# Patient Record
Sex: Male | Born: 1980 | Race: White | Hispanic: No | Marital: Married | Smoking: Never smoker
Health system: Southern US, Community
[De-identification: ages and names within clinical notes are randomized; demographics above are authoritative.]

## PROBLEM LIST (undated history)

## (undated) DIAGNOSIS — K219 Gastro-esophageal reflux disease without esophagitis: Secondary | ICD-10-CM

## (undated) HISTORY — DX: Gastro-esophageal reflux disease without esophagitis: K21.9

## (undated) HISTORY — PX: VASECTOMY: SHX75

---

## 2016-12-19 ENCOUNTER — Encounter (HOSPITAL_COMMUNITY): Payer: Self-pay

## 2016-12-19 ENCOUNTER — Emergency Department (HOSPITAL_COMMUNITY)
Admission: EM | Admit: 2016-12-19 | Discharge: 2016-12-19 | Disposition: A | Discharge status: Home / Self Care | Payer: No Typology Code available for payment source | Attending: Emergency Medicine | Admitting: Emergency Medicine

## 2016-12-19 DIAGNOSIS — Y939 Activity, unspecified: Secondary | ICD-10-CM | POA: Insufficient documentation

## 2016-12-19 DIAGNOSIS — Y999 Unspecified external cause status: Secondary | ICD-10-CM | POA: Insufficient documentation

## 2016-12-19 DIAGNOSIS — S0502XA Injury of conjunctiva and corneal abrasion without foreign body, left eye, initial encounter: Secondary | ICD-10-CM | POA: Diagnosis not present

## 2016-12-19 DIAGNOSIS — X58XXXA Exposure to other specified factors, initial encounter: Secondary | ICD-10-CM | POA: Insufficient documentation

## 2016-12-19 DIAGNOSIS — Y929 Unspecified place or not applicable: Secondary | ICD-10-CM | POA: Diagnosis not present

## 2016-12-19 MED ORDER — FLUORESCEIN SODIUM 0.6 MG OP STRP
1.0000 | ORAL_STRIP | Freq: Once | OPHTHALMIC | Status: AC
  Administered 2016-12-19: 1 via OPHTHALMIC
  Filled 2016-12-19: qty 1

## 2016-12-19 MED ORDER — TOBRAMYCIN 0.3 % OP SOLN: 2.0000 [drp] | OPHTHALMIC | 0 refills | Status: AC

## 2016-12-19 NOTE — ED Provider Notes (Signed)
[facility] DEPT Provider Note   CSN: [id number] Arrival date & time: 12/19/16  0429     History   Chief Complaint Chief Complaint  Patient presents with  . Eye Problem    HPI Andrew Wall is a 36 y.o. male.  The history is provided by the patient. No language interpreter was used.  Eye Problem   This is a new problem. The current episode started yesterday. The problem occurs constantly. The problem has not changed since onset.There is a problem in the left eye. The injury mechanism was a foreign body. The pain is moderate. There is no history of trauma to the eye. There is no known exposure to pink eye. He does not wear contacts. Pertinent negatives include no vomiting. He has tried nothing for the symptoms. The treatment provided no relief.  Pt complains of left eye pain.  Pt thinks something blew into his eye while he was on a boat.  Pt reels like something is under his eyelid   History reviewed. No pertinent past medical history.  There are no active problems to display for this patient.   History reviewed. No pertinent surgical history.     Home Medications    Prior to Admission medications   Medication Sig Start Date End Date Taking? Authorizing Provider  tobramycin (TOBREX) 0.3 % ophthalmic solution Place 2 drops into the left eye every 4 (four) hours. 12/19/16   Fransico Meadow, PA-C    Family History History reviewed. No pertinent family history.  Social History Social History  Substance Use Topics  . Smoking status: Not on file  . Smokeless tobacco: Not on file  . Alcohol use Not on file     Allergies   Patient has no known allergies.   Review of Systems Review of Systems  Gastrointestinal: Negative for vomiting.  All other systems reviewed and are negative.    Physical Exam Updated Vital Signs BP 112/75   Pulse 63   Temp 97.5 F (36.4 C) (Oral)   Resp 16   Ht 6\' 3"  (1.905 m)   Wt 88.5 kg   SpO2 100%   BMI 24.37 kg/m   Physical Exam   Constitutional: He is oriented to person, place, and time. He appears well-developed and well-nourished.  HENT:  Head: Normocephalic.  Eyes: EOM are normal. Pupils are equal, round, and reactive to light.  Injected left conjunctiva  Everted lids no foreign body, fluorescein  Small linear area of uptake,  No foreign boday  Neck: Normal range of motion.  Pulmonary/Chest: Effort normal.  Abdominal: He exhibits no distension.  Musculoskeletal: Normal range of motion.  Neurological: He is alert and oriented to person, place, and time.  Psychiatric: He has a normal mood and affect.  Nursing note and vitals reviewed.    ED Treatments / Results  Labs (all labs ordered are listed, but only abnormal results are displayed) Labs Reviewed - No data to display  EKG  EKG Interpretation None       Radiology No results found.  Procedures Procedures (including critical care time)  Medications Ordered in ED Medications  fluorescein ophthalmic strip 1 strip (1 strip Left Eye Given 12/19/16 9622)     Initial Impression / Assessment and Plan / ED Course  I have reviewed the triage vital signs and the nursing notes.  Pertinent labs & imaging results that were available during my care of the patient were reviewed by me and considered in my medical decision making (see chart for  details).       Final Clinical Impressions(s) / ED Diagnoses   Final diagnoses:  Abrasion of left cornea, initial encounter    New Prescriptions New Prescriptions   TOBRAMYCIN (TOBREX) 0.3 % OPHTHALMIC SOLUTION    Place 2 drops into the left eye every 4 (four) hours.  An After Visit Summary was printed and given to the patient.   Hollace Kinnier [place], PA-C 12/19/16 [place], MD 12/19/16 [phone]

## 2016-12-19 NOTE — Discharge Instructions (Signed)
Return if any problems.

## 2016-12-19 NOTE — ED Triage Notes (Signed)
Pt here for left eye pain, sts thought he got something in it yesteday and then went to sleep after flushing it but woke up and still bothering him.

## 2017-09-21 ENCOUNTER — Ambulatory Visit
Admission: RE | Admit: 2017-09-21 | Discharge: 2017-09-21 | Disposition: A | Discharge status: Home / Self Care | Payer: No Typology Code available for payment source | Source: Ambulatory Visit | Attending: Internal Medicine | Admitting: Internal Medicine

## 2017-09-21 ENCOUNTER — Other Ambulatory Visit: Payer: Self-pay | Admitting: Internal Medicine

## 2017-09-21 DIAGNOSIS — R05 Cough: Secondary | ICD-10-CM

## 2017-09-21 DIAGNOSIS — R059 Cough, unspecified: Secondary | ICD-10-CM

## 2019-01-11 DIAGNOSIS — B070 Plantar wart: Secondary | ICD-10-CM | POA: Diagnosis not present

## 2019-07-03 DIAGNOSIS — H0014 Chalazion left upper eyelid: Secondary | ICD-10-CM | POA: Diagnosis not present

## 2019-09-13 DIAGNOSIS — B070 Plantar wart: Secondary | ICD-10-CM | POA: Diagnosis not present

## 2019-09-13 DIAGNOSIS — D2271 Melanocytic nevi of right lower limb, including hip: Secondary | ICD-10-CM | POA: Diagnosis not present

## 2019-12-19 DIAGNOSIS — B070 Plantar wart: Secondary | ICD-10-CM | POA: Diagnosis not present

## 2020-01-09 DIAGNOSIS — H40013 Open angle with borderline findings, low risk, bilateral: Secondary | ICD-10-CM | POA: Diagnosis not present

## 2020-02-22 DIAGNOSIS — H40013 Open angle with borderline findings, low risk, bilateral: Secondary | ICD-10-CM | POA: Diagnosis not present

## 2020-02-22 DIAGNOSIS — H0102A Squamous blepharitis right eye, upper and lower eyelids: Secondary | ICD-10-CM | POA: Diagnosis not present

## 2020-02-22 DIAGNOSIS — H0102B Squamous blepharitis left eye, upper and lower eyelids: Secondary | ICD-10-CM | POA: Diagnosis not present

## 2020-02-27 DIAGNOSIS — R946 Abnormal results of thyroid function studies: Secondary | ICD-10-CM | POA: Diagnosis not present

## 2020-02-27 DIAGNOSIS — G4452 New daily persistent headache (NDPH): Secondary | ICD-10-CM | POA: Diagnosis not present

## 2020-02-28 ENCOUNTER — Ambulatory Visit
Admission: RE | Admit: 2020-02-28 | Discharge: 2020-02-28 | Disposition: A | Discharge status: Home / Self Care | Payer: BC Managed Care – PPO | Source: Ambulatory Visit | Attending: Internal Medicine | Admitting: Internal Medicine

## 2020-02-28 ENCOUNTER — Other Ambulatory Visit: Payer: Self-pay | Admitting: Internal Medicine

## 2020-02-28 DIAGNOSIS — R519 Headache, unspecified: Secondary | ICD-10-CM | POA: Diagnosis not present

## 2020-02-28 DIAGNOSIS — G4452 New daily persistent headache (NDPH): Secondary | ICD-10-CM

## 2020-02-28 MED ORDER — GADOBENATE DIMEGLUMINE 529 MG/ML IV SOLN
18.0000 mL | Freq: Once | INTRAVENOUS | Status: AC | PRN
  Administered 2020-02-28: 18 mL via INTRAVENOUS

## 2020-03-31 DIAGNOSIS — B070 Plantar wart: Secondary | ICD-10-CM | POA: Diagnosis not present

## 2020-05-06 ENCOUNTER — Other Ambulatory Visit: Payer: Self-pay | Admitting: General Surgery

## 2020-05-06 DIAGNOSIS — R109 Unspecified abdominal pain: Secondary | ICD-10-CM | POA: Diagnosis not present

## 2020-05-07 ENCOUNTER — Ambulatory Visit
Admission: RE | Admit: 2020-05-07 | Discharge: 2020-05-07 | Disposition: A | Discharge status: Home / Self Care | Payer: BC Managed Care – PPO | Source: Ambulatory Visit | Attending: General Surgery | Admitting: General Surgery

## 2020-05-07 DIAGNOSIS — R109 Unspecified abdominal pain: Secondary | ICD-10-CM

## 2020-05-07 DIAGNOSIS — R1013 Epigastric pain: Secondary | ICD-10-CM | POA: Diagnosis not present

## 2020-05-15 ENCOUNTER — Ambulatory Visit
Admission: RE | Admit: 2020-05-15 | Discharge: 2020-05-15 | Disposition: A | Discharge status: Home / Self Care | Payer: BC Managed Care – PPO | Source: Ambulatory Visit | Attending: Internal Medicine | Admitting: Internal Medicine

## 2020-05-15 ENCOUNTER — Other Ambulatory Visit: Payer: Self-pay | Admitting: Internal Medicine

## 2020-05-15 DIAGNOSIS — R2233 Localized swelling, mass and lump, upper limb, bilateral: Secondary | ICD-10-CM

## 2020-05-15 DIAGNOSIS — N6489 Other specified disorders of breast: Secondary | ICD-10-CM | POA: Diagnosis not present

## 2020-05-20 ENCOUNTER — Other Ambulatory Visit: Payer: Self-pay

## 2020-05-20 ENCOUNTER — Telehealth: Payer: Self-pay | Admitting: Internal Medicine

## 2020-05-20 DIAGNOSIS — R198 Other specified symptoms and signs involving the digestive system and abdomen: Secondary | ICD-10-CM

## 2020-05-20 DIAGNOSIS — R109 Unspecified abdominal pain: Secondary | ICD-10-CM

## 2020-05-20 DIAGNOSIS — Z8711 Personal history of peptic ulcer disease: Secondary | ICD-10-CM

## 2020-05-20 MED ORDER — SUTAB 1479-225-188 MG PO TABS: ORAL_TABLET | ORAL | 0 refills | Status: DC

## 2020-05-20 NOTE — Telephone Encounter (Signed)
Pt scheduled for ECL in the [facility] 05/22/20 at 4pm. Pt knows to arrive at 3pm and to have a driver with him for the procedure. Prep script sent to pharmacy and pt sent prep instructions. Ambulatory referral entered in epic and message sent to [facility] for precert.

## 2020-05-20 NOTE — Telephone Encounter (Signed)
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  He has a prior history of gastritis/peptic ulcer disease.  He has been on PPI, but symptoms have recurred.  Describes tightness and pain that can awaken him at night.  No alarm features to this point, such as bleeding, weight loss, or fevers.  However, symptoms seem to be exacerbated by meals with complaints of abdominal tightness.  Appetite is poor when having pain.  Recent blood work including liver tests and lipase were unremarkable.  As well, abdominal ultrasound is unremarkable.  At this point, further work-up is warranted.  PLAN:  1.  Colonoscopy and upper endoscopy in the [facility] this Thursday afternoon September 30 at 4 PM 2.  If endoscopic evaluations are unrevealing, consider advanced imaging such as CT scan and possibly gastric emptying scan.  Vaughan Basta, Please contact Dr. [name] and assist him with scheduling and instructions for his endoscopic evaluations.  Thank you.

## 2020-05-21 ENCOUNTER — Telehealth: Payer: Self-pay | Admitting: Internal Medicine

## 2020-05-21 NOTE — Telephone Encounter (Signed)
Patient called states the pharmacy does not have his prep medication please resend

## 2020-05-21 NOTE — Telephone Encounter (Signed)
Prescription was sent electronically yesterday but did not go through. Script called to pharmacy. Left message for pt to call back if very expensive and can give him manufacturer codes for pharmacy to run through.

## 2020-05-22 ENCOUNTER — Encounter: Payer: Self-pay | Admitting: Internal Medicine

## 2020-05-22 ENCOUNTER — Other Ambulatory Visit: Payer: Self-pay

## 2020-05-22 ENCOUNTER — Ambulatory Visit (AMBULATORY_SURGERY_CENTER): Payer: BC Managed Care – PPO | Admitting: Internal Medicine

## 2020-05-22 VITALS — BP 103/56 | HR 81 | Temp 98.1°F | Resp 22

## 2020-05-22 DIAGNOSIS — Z8711 Personal history of peptic ulcer disease: Secondary | ICD-10-CM

## 2020-05-22 DIAGNOSIS — R109 Unspecified abdominal pain: Secondary | ICD-10-CM

## 2020-05-22 DIAGNOSIS — D120 Benign neoplasm of cecum: Secondary | ICD-10-CM

## 2020-05-22 DIAGNOSIS — R1013 Epigastric pain: Secondary | ICD-10-CM | POA: Diagnosis not present

## 2020-05-22 DIAGNOSIS — K635 Polyp of colon: Secondary | ICD-10-CM

## 2020-05-22 DIAGNOSIS — R140 Abdominal distension (gaseous): Secondary | ICD-10-CM

## 2020-05-22 MED ORDER — SODIUM CHLORIDE 0.9 % IV SOLN: 500.0000 mL | Freq: Once | INTRAVENOUS | Status: DC

## 2020-05-22 MED ORDER — HYOSCYAMINE SULFATE 0.125 MG SL SUBL: 0.1250 mg | SUBLINGUAL_TABLET | SUBLINGUAL | 1 refills | Status: AC | PRN

## 2020-05-22 NOTE — Progress Notes (Signed)
PT taken to PACU. Monitors in place. VSS. Report given to RN. 

## 2020-05-22 NOTE — Op Note (Signed)
[facility] Patient Name: Andrew Wall Procedure Date: 05/22/2020 3:55 PM MRN: [id number] Endoscopist: Docia Chuck. Henrene Pastor , MD Age: 39 Referring MD:  Date of Birth: [birth year] Gender: Male Account #: [id number] Procedure:                Upper GI endoscopy with biopsies Indications:              Epigastric abdominal pain. History of acid peptic                            disorder Medicines:                Monitored Anesthesia Care Procedure:                Pre-Anesthesia Assessment:                           - Prior to the procedure, a History and Physical                            was performed, and patient medications and                            allergies were reviewed. The patient's tolerance of                            previous anesthesia was also reviewed. The risks                            and benefits of the procedure and the sedation                            options and risks were discussed with the patient.                            All questions were answered, and informed consent                            was obtained. Prior Anticoagulants: The patient has                            taken no previous anticoagulant or antiplatelet                            agents. ASA Grade Assessment: I - A normal, healthy                            patient. After reviewing the risks and benefits,                            the patient was deemed in satisfactory condition to                            undergo the procedure.  After obtaining informed consent, the endoscope was                            passed under direct vision. Throughout the                            procedure, the patient's blood pressure, pulse, and                            oxygen saturations were monitored continuously. The                            Endoscope was introduced through the mouth, and                            advanced to the third part of duodenum. The upper                             GI endoscopy was accomplished without difficulty.                            The patient tolerated the procedure well. Scope In: Scope Out: Findings:                 The esophagus was normal.                           The stomach was normal. Biopsies were taken with a                            cold forceps for Helicobacter pylori testing using                            CLOtest.                           The examined duodenum was normal.                           The cardia and gastric fundus were normal on                            retroflexion. Complications:            No immediate complications. Estimated Blood Loss:     Estimated blood loss: none. Impression:               1. Normal EGD                           2. Epigastric pain                           3. History of ulcer disease. Status post CLO biopsy. Recommendation:           1. Prescribe Levsin sublingual (or generic  equivalent) 0.125 mg; #60; 1 refill; take 1 or 2                            sublingual every 4 hours as needed for pain                           2. Follow-up CLO biopsy assessing for Helicobacter                            pylori                           3. Schedule contrast-enhanced CT scan of the                            abdomen and pelvis with fine cuts to the pancreas                            "epigastric pain exacerbated by meals. Negative                            endoscopic evaluations".                           4. Follow-up to be determined after the above Docia Chuck. Henrene Pastor, MD 05/22/2020 4:45:03 PM This report has been signed electronically.

## 2020-05-22 NOTE — Op Note (Signed)
[facility] Patient Name: Andrew Wall Procedure Date: 05/22/2020 3:56 PM MRN: [id number] Endoscopist: Docia Chuck. Henrene Pastor , MD Age: 39 Referring MD:  Date of Birth: 19-Apr-1981 Gender: Male Account #: [id number] Procedure:                Colonoscopy with cold snare polypectomy x 1 Indications:              Abdominal pain. Unexplained. Exacerbated by meals                            and associated with tightness. Medicines:                Monitored Anesthesia Care Procedure:                Pre-Anesthesia Assessment:                           - Prior to the procedure, a History and Physical                            was performed, and patient medications and                            allergies were reviewed. The patient's tolerance of                            previous anesthesia was also reviewed. The risks                            and benefits of the procedure and the sedation                            options and risks were discussed with the patient.                            All questions were answered, and informed consent                            was obtained. Prior Anticoagulants: The patient has                            taken no previous anticoagulant or antiplatelet                            agents. ASA Grade Assessment: I - A normal, healthy                            patient. After reviewing the risks and benefits,                            the patient was deemed in satisfactory condition to                            undergo the procedure.  After obtaining informed consent, the colonoscope                            was passed under direct vision. Throughout the                            procedure, the patient's blood pressure, pulse, and                            oxygen saturations were monitored continuously. The                            Colonoscope was introduced through the anus and                            advanced to the  the cecum, identified by                            appendiceal orifice and ileocecal valve. The                            terminal ileum, ileocecal valve, appendiceal                            orifice, and rectum were photographed. The quality                            of the bowel preparation was excellent. The                            colonoscopy was performed without difficulty. The                            patient tolerated the procedure well. The bowel                            preparation used was SUPREP via split dose                            instruction. Scope In: 3:61:44 PM Scope Out: 4:19:53 PM Scope Withdrawal Time: 0 hours 11 minutes 12 seconds  Total Procedure Duration: 0 hours 13 minutes 11 seconds  Findings:                 The terminal ileum appeared normal.                           A 3 mm polyp was found in the cecum. The polyp was                            sessile. The polyp was removed with a cold snare.                            Resection and retrieval were complete.  The exam was otherwise without abnormality on                            direct and retroflexion views. Complications:            No immediate complications. Estimated blood loss:                            None. Estimated Blood Loss:     Estimated blood loss: none. Impression:               - The examined portion of the ileum was normal.                           - One 3 mm polyp in the cecum, removed with a cold                            snare. Resected and retrieved.                           - The examination was otherwise normal on direct                            and retroflexion views. Recommendation:           - Repeat colonoscopy in 10 years for surveillance.                           - Patient has a contact number available for                            emergencies. The signs and symptoms of potential                            delayed complications  were discussed with the                            patient. Return to normal activities tomorrow.                            Written discharge instructions were provided to the                            patient.                           - Resume previous diet.                           - Continue present medications.                           - Await pathology results.                           - EGD today. Please see report regarding findings  and final recommendations Docia Chuck. Henrene Pastor, MD 05/22/2020 4:40:37 PM This report has been signed electronically.

## 2020-05-22 NOTE — Progress Notes (Signed)
Orders received from Dr. Henrene Pastor for CT scan with contrast. 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 RN and charge RN attempted to reach nursing staff in Dr Blanch Media office to schedule this, unfortunately they had left for the day. Charge RN left copy of report and message for that RN to see and order the CT scan first thing in the morning. Pt and his wife made aware of this plan. Pt discharged with oral contrast per [facility] policy.

## 2020-05-22 NOTE — Progress Notes (Signed)
Called to room to assist during endoscopic procedure.  Patient ID and intended procedure confirmed with present staff. Received instructions for my participation in the procedure from the performing physician.  

## 2020-05-22 NOTE — Patient Instructions (Signed)
Handout provided on polyps.   Take Levsin sublingual (or generic equivalent) 0.125mg ; take 1-2 sublingual every 4 hours as needed for pain.   Schedule contrast-enhanced CT scan of the abdomen and pelvis with fine cuts to the pancreas "epigastric pain exacerbated by meals. Negative endoscopic evaluations." Dr. Blanch Media office will call to schedule this; CT contrast and instructions provided.  YOU HAD AN ENDOSCOPIC PROCEDURE TODAY AT [facility]:   Refer to the procedure report that was given to you for any specific questions about what was found during the examination.  If the procedure report does not answer your questions, please call your gastroenterologist to clarify.  If you requested that your care partner not be given the details of your procedure findings, then the procedure report has been included in a sealed envelope for you to review at your convenience later.  YOU SHOULD EXPECT: Some feelings of bloating in the abdomen. Passage of more gas than usual.  Walking can help get rid of the air that was put into your GI tract during the procedure and reduce the bloating. If you had a lower endoscopy (such as a colonoscopy or flexible sigmoidoscopy) you may notice spotting of blood in your stool or on the toilet paper. If you underwent a bowel prep for your procedure, you may not have a normal bowel movement for a few days.  Please Note:  You might notice some irritation and congestion in your nose or some drainage.  This is from the oxygen used during your procedure.  There is no need for concern and it should clear up in a day or so.  SYMPTOMS TO REPORT IMMEDIATELY:   Following lower endoscopy (colonoscopy or flexible sigmoidoscopy):  Excessive amounts of blood in the stool  Significant tenderness or worsening of abdominal pains  Swelling of the abdomen that is new, acute  Fever of 100F or higher   Following upper endoscopy (EGD)  Vomiting of blood or coffee ground  material  New chest pain or pain under the shoulder blades  Painful or persistently difficult swallowing  New shortness of breath  Fever of 100F or higher  Black, tarry-looking stools  For urgent or emergent issues, a gastroenterologist can be reached at any hour by calling [phone]. Do not use MyChart messaging for urgent concerns.    DIET:  We do recommend a small meal at first, but then you may proceed to your regular diet.  Drink plenty of fluids but you should avoid alcoholic beverages for 24 hours.  ACTIVITY:  You should plan to take it easy for the rest of today and you should NOT DRIVE or use heavy machinery until tomorrow (because of the sedation medicines used during the test).    FOLLOW UP: Our staff will call the number listed on your records 48-72 hours following your procedure to check on you and address any questions or concerns that you may have regarding the information given to you following your procedure. If we do not reach you, we will leave a message.  We will attempt to reach you two times.  During this call, we will ask if you have developed any symptoms of COVID 19. If you develop any symptoms (ie: fever, flu-like symptoms, shortness of breath, cough etc.) before then, please call [phone].  If you test positive for Covid 19 in the 2 weeks post procedure, please call and report this information to [place].    If any biopsies were taken you will be contacted  by phone or by letter within the next 1-3 weeks.  Please call us at [phone] if you have not heard about the biopsies in 3 weeks.    SIGNATURES/CONFIDENTIALITY: You and/or your care partner have signed paperwork which will be entered into your electronic medical record.  These signatures attest to the fact that that the information above on your After Visit Summary has been reviewed and is understood.  Full responsibility of the confidentiality of this discharge information lies with you and/or your  care-partner.

## 2020-05-23 ENCOUNTER — Other Ambulatory Visit: Payer: Self-pay

## 2020-05-23 ENCOUNTER — Ambulatory Visit (INDEPENDENT_AMBULATORY_CARE_PROVIDER_SITE_OTHER)
Admission: RE | Admit: 2020-05-23 | Discharge: 2020-05-23 | Disposition: A | Discharge status: Home / Self Care | Payer: BC Managed Care – PPO | Source: Ambulatory Visit | Attending: Internal Medicine | Admitting: Internal Medicine

## 2020-05-23 ENCOUNTER — Encounter: Payer: BC Managed Care – PPO | Admitting: Internal Medicine

## 2020-05-23 DIAGNOSIS — R109 Unspecified abdominal pain: Secondary | ICD-10-CM

## 2020-05-23 DIAGNOSIS — R1010 Upper abdominal pain, unspecified: Secondary | ICD-10-CM | POA: Diagnosis not present

## 2020-05-23 LAB — HELICOBACTER PYLORI SCREEN-BIOPSY: UREASE: NEGATIVE

## 2020-05-23 MED ORDER — IOHEXOL 300 MG/ML  SOLN
100.0000 mL | Freq: Once | INTRAMUSCULAR | Status: AC | PRN
  Administered 2020-05-23: 11:00:00 100 mL via INTRAVENOUS

## 2020-05-26 ENCOUNTER — Telehealth: Payer: Self-pay | Admitting: *Deleted

## 2020-05-26 NOTE — Telephone Encounter (Signed)
  Follow up Call-  Call back number 05/22/2020  Post procedure Call Back phone  # [phone]  Permission to leave phone message Yes  Some recent data might be hidden     Patient questions:  Do you have a fever, pain , or abdominal swelling? No. Pain Score  0 *  Have you tolerated food without any problems? Yes.    Have you been able to return to your normal activities? Yes.    Do you have any questions about your discharge instructions: Diet   No. Medications  No. Follow up visit  No.  Do you have questions or concerns about your Care? No.  Actions: * If pain score is 4 or above: No action needed, pain <4.  1. Have you developed a fever since your procedure? no  2.   Have you had an respiratory symptoms (SOB or cough) since your procedure? no  3.   Have you tested positive for COVID 19 since your procedure no  4.   Have you had any family members/close contacts diagnosed with the COVID 19 since your procedure?  no   If yes to any of these questions please route to Joylene John, RN and Joella Prince, RN

## 2020-05-28 DIAGNOSIS — B078 Other viral warts: Secondary | ICD-10-CM | POA: Diagnosis not present

## 2020-05-28 DIAGNOSIS — B079 Viral wart, unspecified: Secondary | ICD-10-CM | POA: Diagnosis not present

## 2020-05-29 ENCOUNTER — Encounter: Payer: Self-pay | Admitting: Internal Medicine

## 2020-06-04 DIAGNOSIS — Z23 Encounter for immunization: Secondary | ICD-10-CM | POA: Diagnosis not present

## 2020-07-03 ENCOUNTER — Other Ambulatory Visit: Payer: Self-pay | Admitting: Internal Medicine

## 2020-07-03 ENCOUNTER — Ambulatory Visit
Admission: RE | Admit: 2020-07-03 | Discharge: 2020-07-03 | Disposition: A | Discharge status: Home / Self Care | Payer: BC Managed Care – PPO | Source: Ambulatory Visit | Attending: Internal Medicine | Admitting: Internal Medicine

## 2020-07-03 ENCOUNTER — Other Ambulatory Visit: Payer: Self-pay

## 2020-07-03 ENCOUNTER — Other Ambulatory Visit (HOSPITAL_COMMUNITY)
Admission: RE | Admit: 2020-07-03 | Discharge: 2020-07-03 | Disposition: A | Discharge status: Home / Self Care | Payer: BC Managed Care – PPO | Source: Ambulatory Visit | Attending: Diagnostic Radiology | Admitting: Diagnostic Radiology

## 2020-07-03 DIAGNOSIS — R590 Localized enlarged lymph nodes: Secondary | ICD-10-CM

## 2020-07-03 DIAGNOSIS — R599 Enlarged lymph nodes, unspecified: Secondary | ICD-10-CM | POA: Diagnosis not present

## 2020-07-04 LAB — SURGICAL PATHOLOGY

## 2020-08-01 DIAGNOSIS — H40013 Open angle with borderline findings, low risk, bilateral: Secondary | ICD-10-CM | POA: Diagnosis not present

## 2020-08-01 DIAGNOSIS — D225 Melanocytic nevi of trunk: Secondary | ICD-10-CM | POA: Diagnosis not present

## 2020-08-01 DIAGNOSIS — B070 Plantar wart: Secondary | ICD-10-CM | POA: Diagnosis not present

## 2020-08-01 DIAGNOSIS — H5711 Ocular pain, right eye: Secondary | ICD-10-CM | POA: Diagnosis not present

## 2020-10-27 DIAGNOSIS — R82998 Other abnormal findings in urine: Secondary | ICD-10-CM | POA: Diagnosis not present

## 2020-12-06 IMAGING — CT CT ABD-PELV W/ CM
2 of 4 series · 17 of 46 positions shown, 19 images · IV contrast (OMNIPAQUE 300)
Comparison: 05/07/2020 abdominal sonogram.

CLINICAL DATA: Intermittent upper abdominal pain for few weeks.
Normal ultrasound. Unremarkable upper and lower endoscopy 1 day
prior.

EXAM:
CT ABDOMEN AND PELVIS WITH CONTRAST
TECHNIQUE: Multidetector CT imaging of the abdomen and pelvis was performed
using the standard protocol following bolus administration of
intravenous contrast.
CONTRAST:  100mL OMNIPAQUE IOHEXOL 300 MG/ML  SOLN

[Series 2: abd/pel w · axial · 0.84mm/px · z∈[+829,+1264]mm · 14 of 95 slices shown, 16 images]
[im 4/95  soft-tissue]
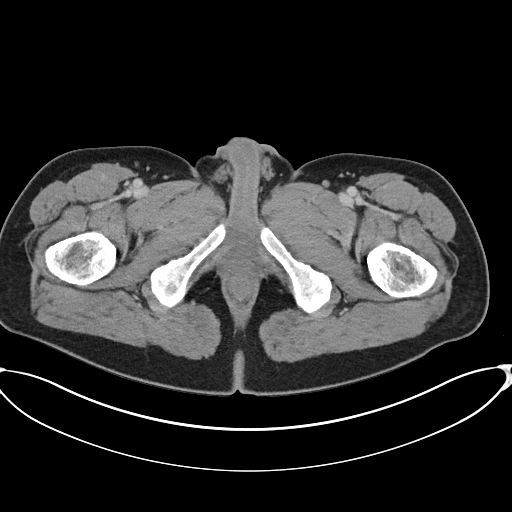
[im 4/95  bone]
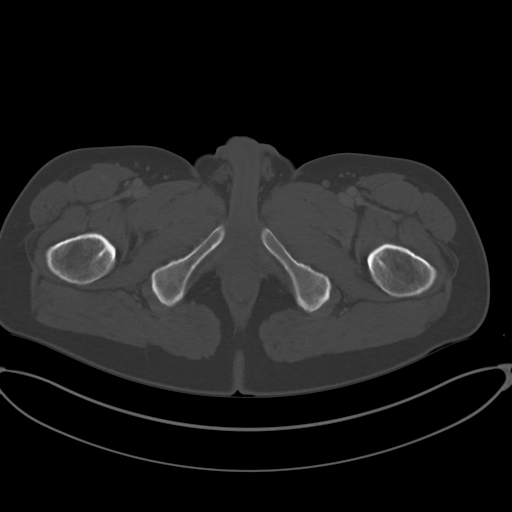
[im 12/95  soft-tissue]
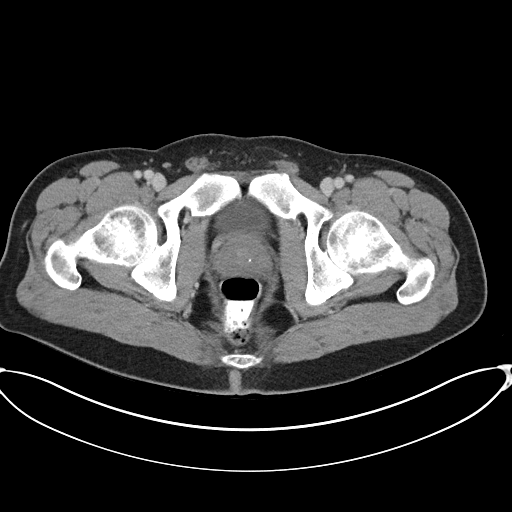
[im 19/95  soft-tissue]
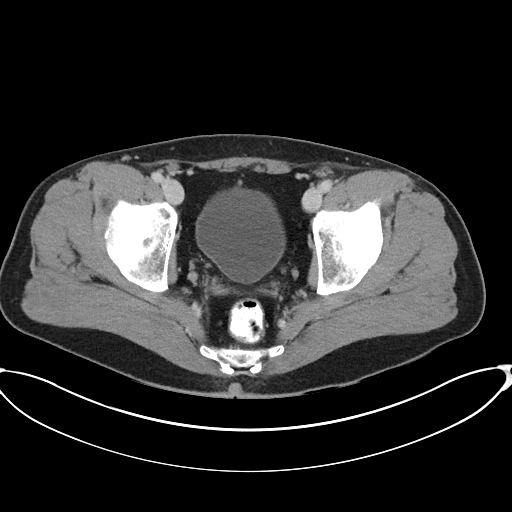
[im 27/95  soft-tissue]
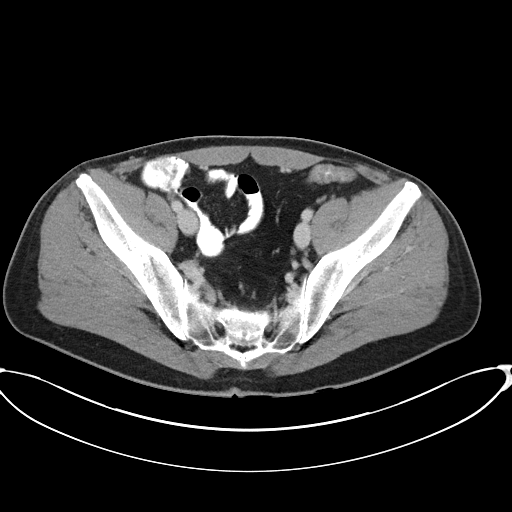
[im 31/95  soft-tissue]
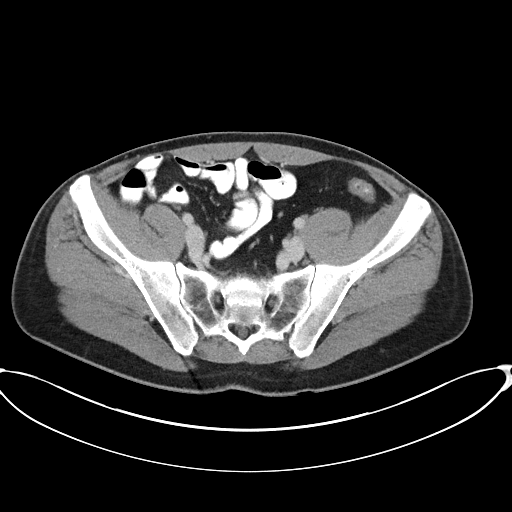
[im 38/95  soft-tissue]
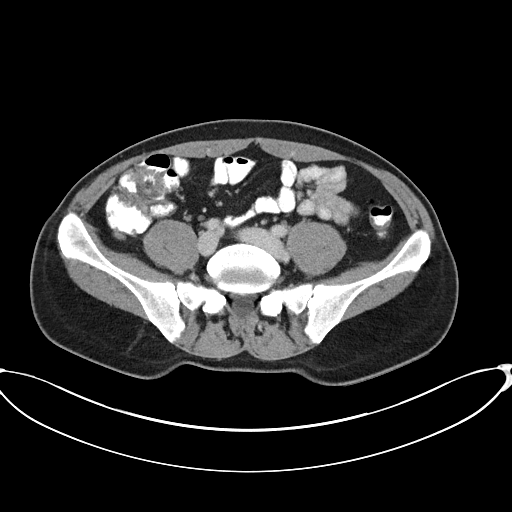
[im 46/95  soft-tissue]
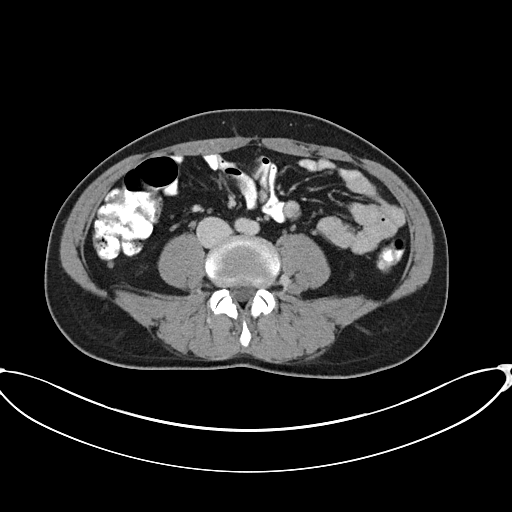
[im 49/95  soft-tissue]
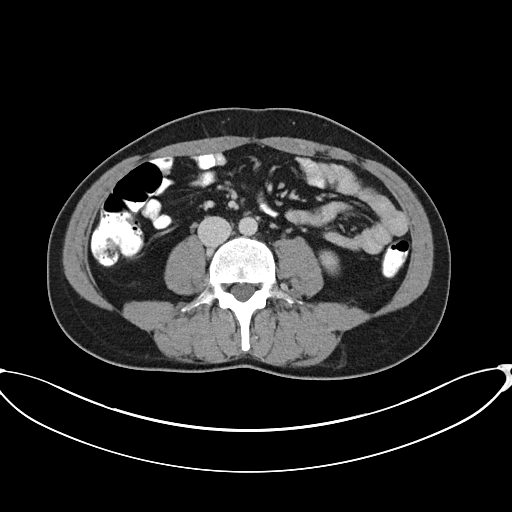
[im 57/95  soft-tissue]
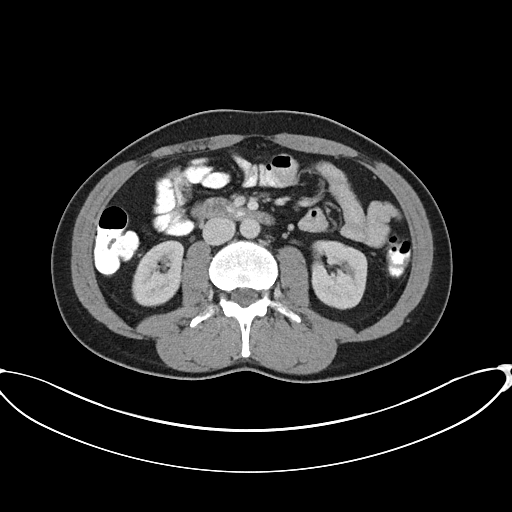
[im 57/95  bone]
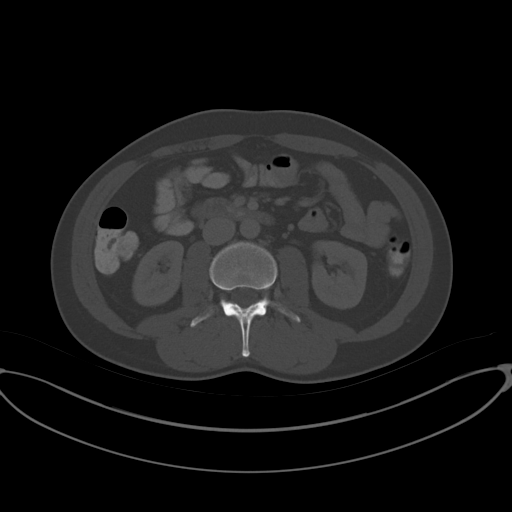
[im 64/95  soft-tissue]
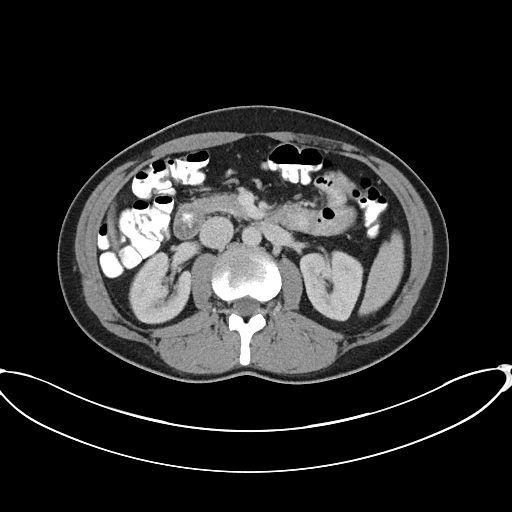
[im 72/95  soft-tissue]
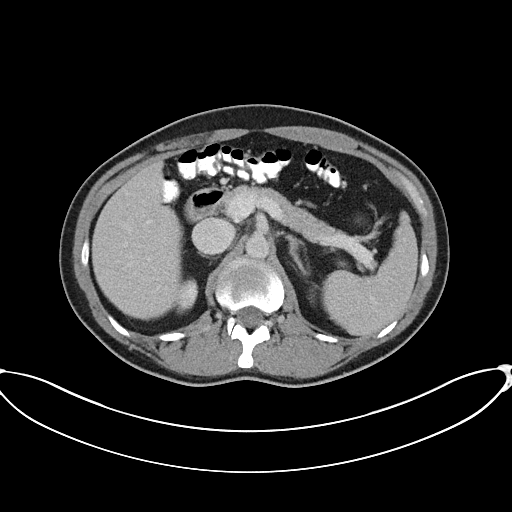
[im 76/95  soft-tissue]
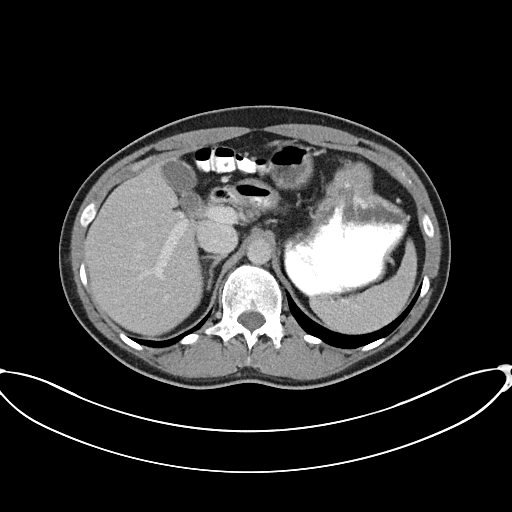
[im 83/95  soft-tissue]
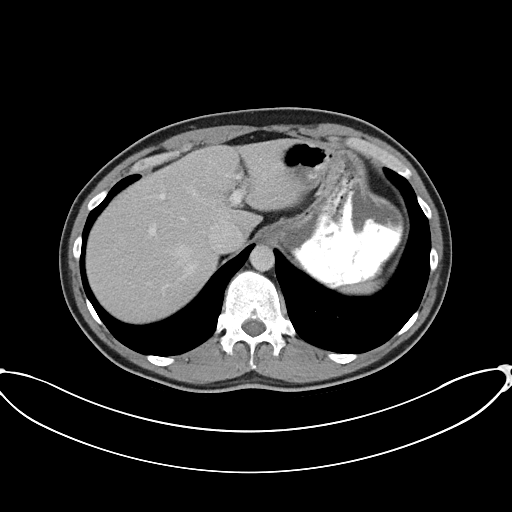
[im 91/95  soft-tissue]
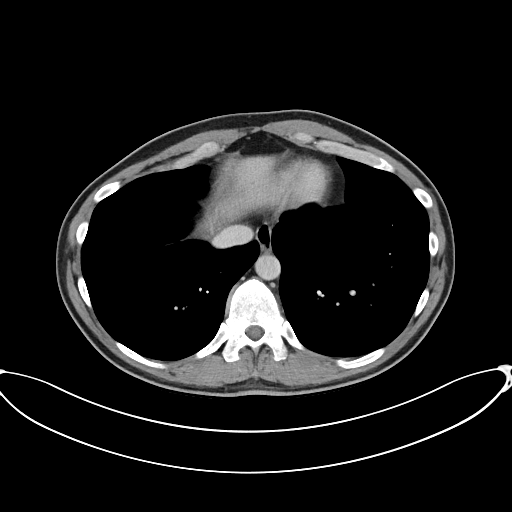

[Series 5: abd/pel w st · coronal · 0.87mm/px · 3 of 100 slices shown]
[im 34/100  soft-tissue]
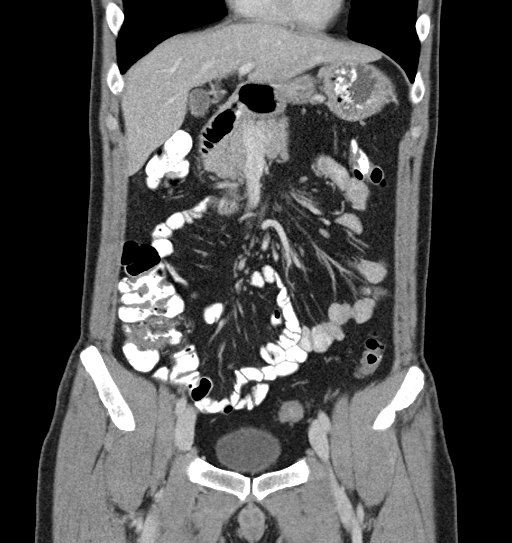
[im 45/100  soft-tissue]
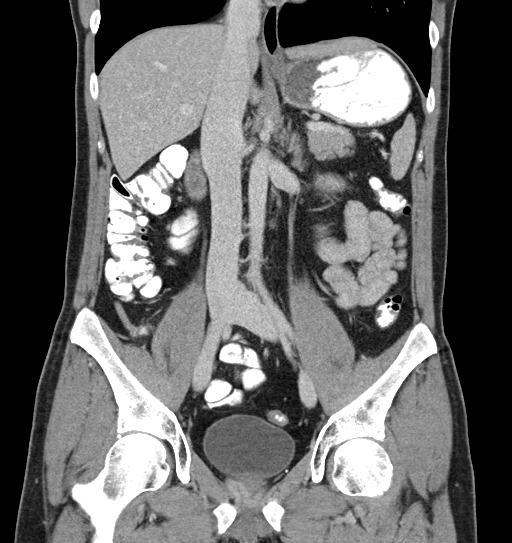
[im 56/100  soft-tissue]
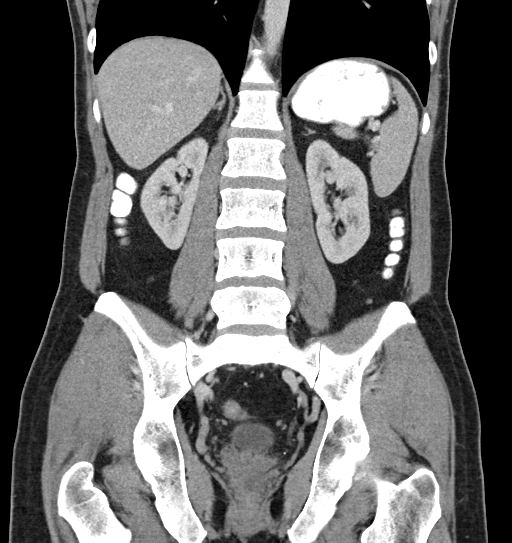

[17 of 46 positions shown; findings below may reference images not displayed]

FINDINGS: Lower chest: No significant pulmonary nodules or acute consolidative
airspace disease.

Hepatobiliary: Normal liver size. No liver mass. Normal gallbladder
with no radiopaque cholelithiasis. No biliary ductal dilatation.

Pancreas: Normal, with no mass or duct dilation.

Spleen: Normal size. No mass.

Adrenals/Urinary Tract: Normal adrenals. Normal kidneys with no
hydronephrosis and no renal mass. Normal bladder.

Stomach/Bowel: Normal non-distended stomach. Normal caliber small
bowel with no small bowel wall thickening. Normal appendix. Oral
contrast transits to the rectum. Normal large bowel with no
diverticulosis, large bowel wall thickening or pericolonic fat
stranding.

Vascular/Lymphatic: Normal caliber abdominal aorta. Patent portal,
splenic, hepatic and renal veins. No pathologically enlarged lymph
nodes in the abdomen or pelvis.

Reproductive: Normal size prostate.

Other: No pneumoperitoneum, ascites or focal fluid collection.

Musculoskeletal: No aggressive appearing focal osseous lesions.
IMPRESSION: Normal CT of the abdomen and pelvis. No acute abnormality. No CT
findings to explain the patient's presenting symptoms.

## 2021-01-02 DIAGNOSIS — B070 Plantar wart: Secondary | ICD-10-CM | POA: Diagnosis not present

## 2021-01-16 DIAGNOSIS — H5711 Ocular pain, right eye: Secondary | ICD-10-CM | POA: Diagnosis not present

## 2021-01-16 IMAGING — US US AXILLARY NODE CORE BIOPSY LEFT
1 series · 15 of 17 positions shown · non-contrast
Comparison: Previous exam(s).
COMPARISON: Previous exam(s).

Addendum:
CLINICAL DATA: Patient presents for follow-up versus possible
biopsy of bilateral axillary lymph nodes.

[Series 1: us axillary node core biopsy left · 0.06mm/px · 15 of 17 slices shown]
[im 1/17]
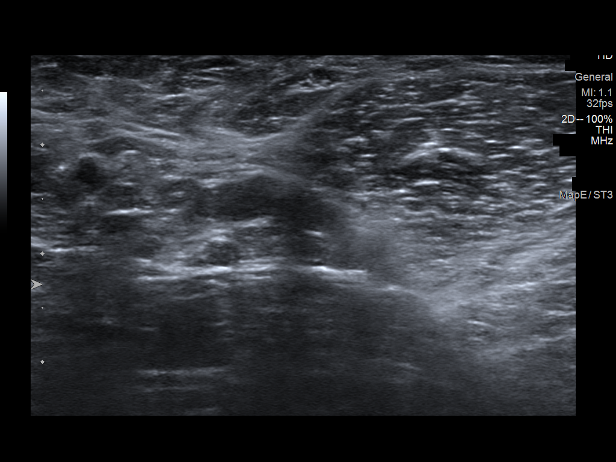
[im 2/17]
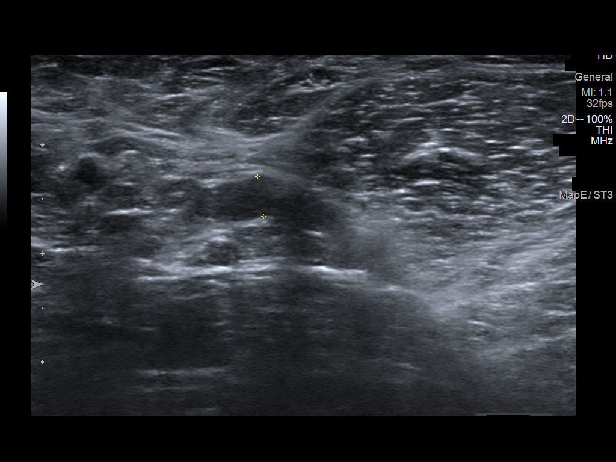
[im 3/17]
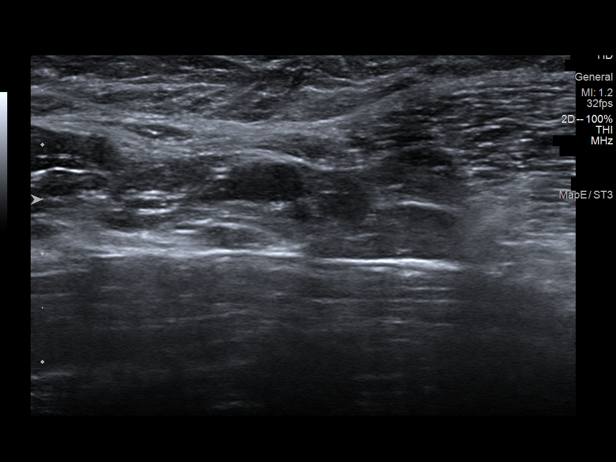
[im 4/17]
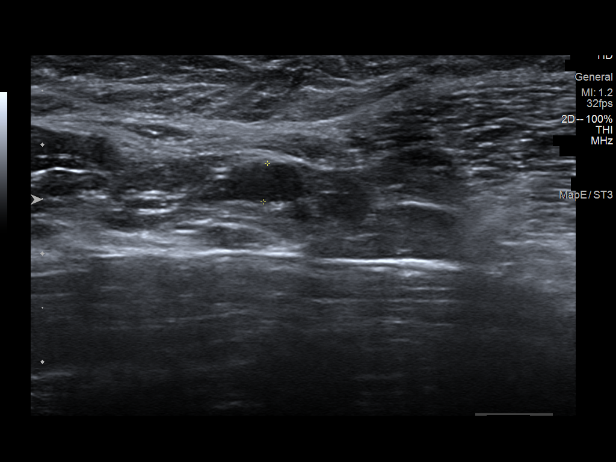
[im 6/17]
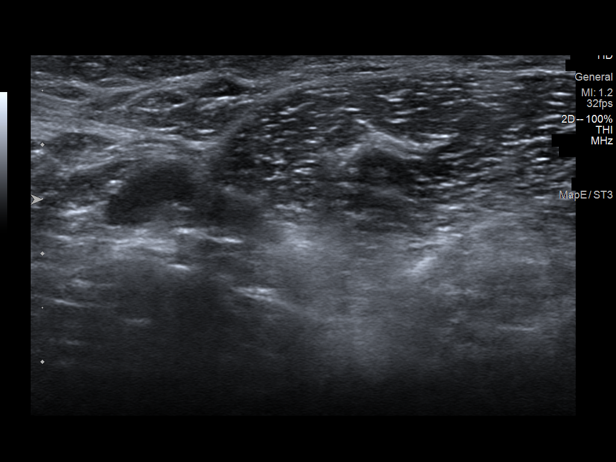
[im 7/17]
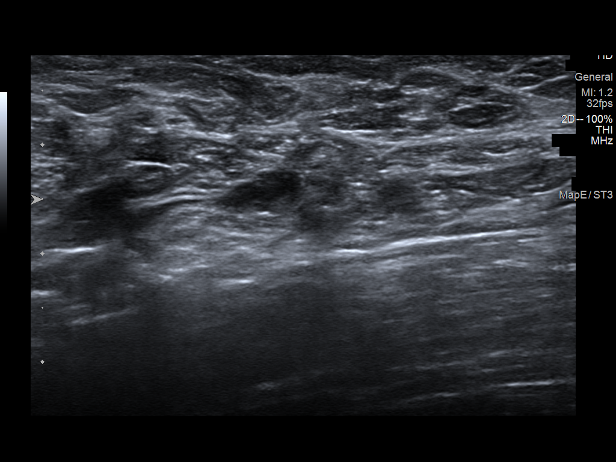
[im 8/17]
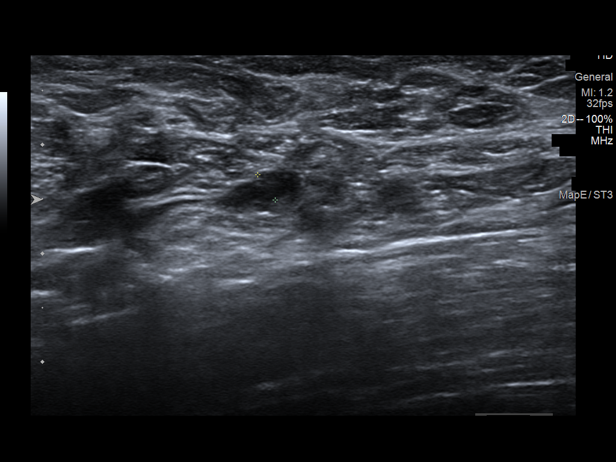
[im 9/17]
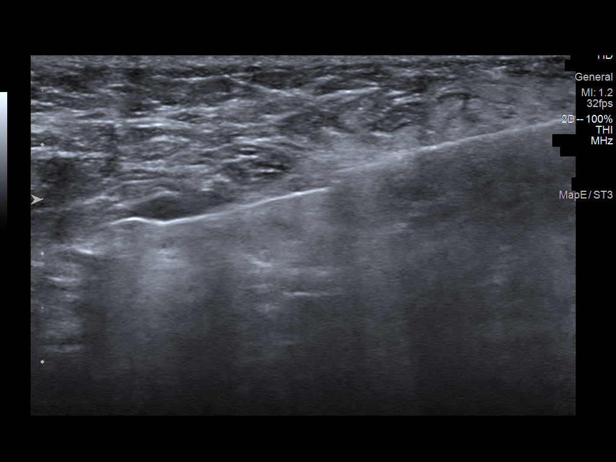
[im 10/17]
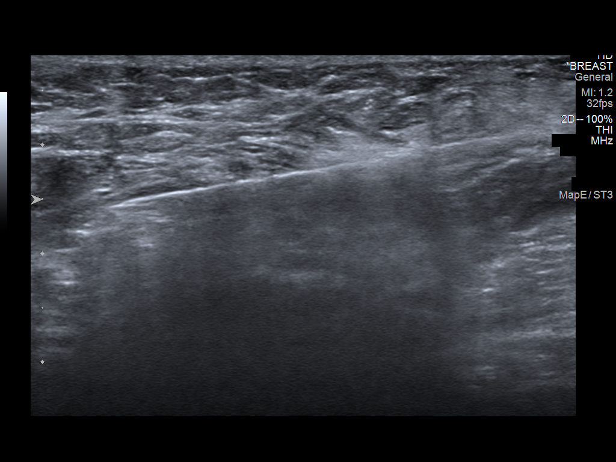
[im 11/17]
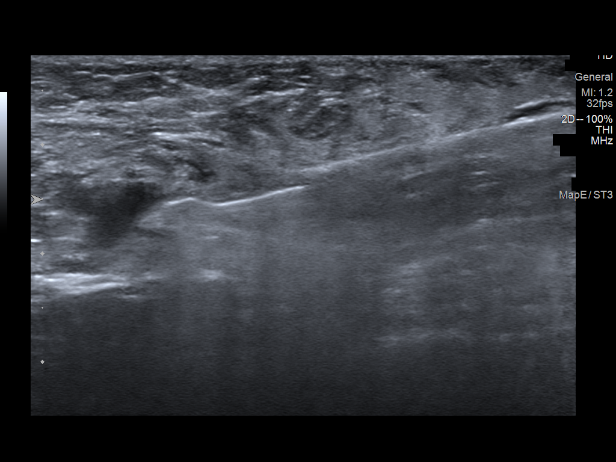
[im 12/17]
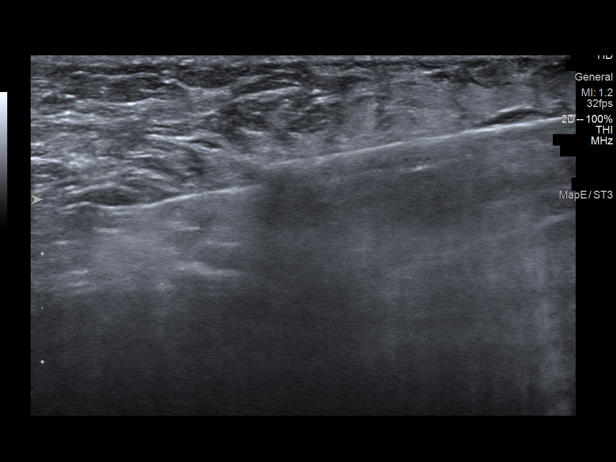
[im 14/17]
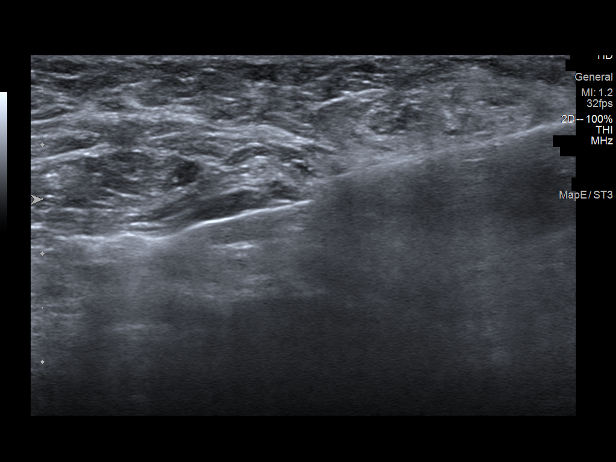
[im 15/17]
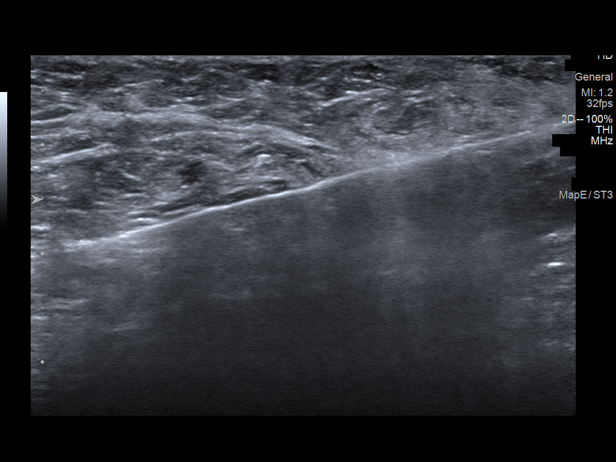
[im 16/17]
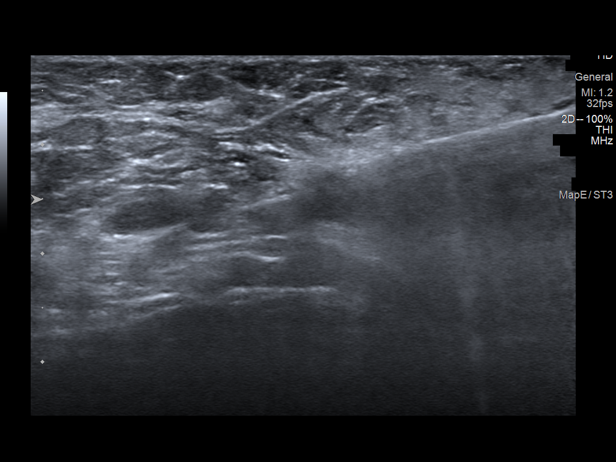
[im 17/17]
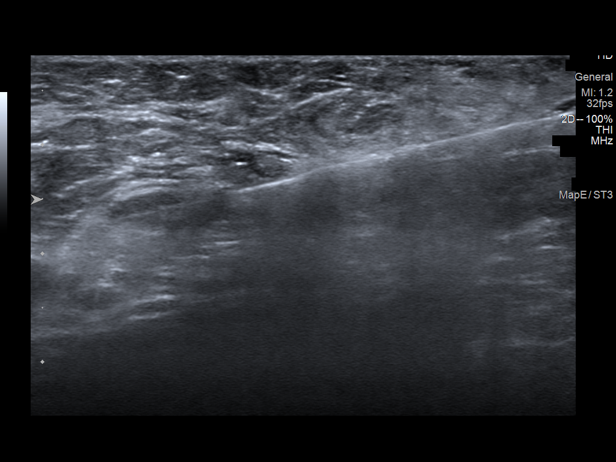

[15 of 17 positions shown; findings below may reference images not displayed]

Patient had first Iovid-N7 vaccine in the RIGHT arm and second LEFT
arm in Sunday December, 2019. After the second vaccination, patient developed
visible tender LEFT axillary swelling. Following IV injection of
contrast for MRI in [REDACTED], patient developed thrombosis of the RIGHT
cephalic vein.

EXAM:
US AXILLARY NODE CORE BIOPSY RIGHT

US AXILLARY NODE CORE BIOPSY LEFT
PROCEDURE:
RIGHT axilla: Targeted ultrasound is performed of the RIGHT axilla,
again showing single mildly prominent lymph node in the UPPER RIGHT
axilla, with cortex of 3.7 millimeters, previously 4.5 millimeters.
Other axillary lymph nodes have normal morphology.

LEFT axilla: Targeted ultrasound again demonstrates a mildly
prominent lymph node in the LOWER LEFT axilla, with cortical
thickening of 3.7 millimeters, previously 3.6 millimeters. A second
lymph node has a cortical thickness of 2.8 millimeters, UPPER limits
normal, and stable. Other lymph nodes have normal morphology.

Given the persistent prominence of bilateral single axial lymph
nodes, we discussed recommendation for ultrasound-guided core biopsy
of bilateral axillary lymph nodes. Patient agrees to biopsy,
performed on the same day.

I met with the patient and we discussed the procedure of
ultrasound-guided biopsy, including benefits and alternatives. We
discussed the high likelihood of a successful procedure. We
discussed the risks of the procedure, including infection, bleeding,
tissue injury, clip migration, and inadequate sampling. Informed
written consent was given. The usual time-out protocol was performed
immediately prior to the procedure.

RIGHT axilla:

Using sterile technique and 1% Lidocaine as local anesthetic, under
direct ultrasound visualization, a 14 gauge Es device was
used to perform biopsy of enlarged RIGHT axillary lymph node using a
LATERAL to MEDIAL approach.

LEFT axilla:

Using sterile technique and 1% Lidocaine as local anesthetic, under
direct ultrasound visualization, a 14 gauge Es device was
used to perform biopsy of enlarged LEFT axillary lymph node using a
LATERAL to MEDIAL approach.
IMPRESSION: Ultrasound guided biopsy of bilateral axillary lymph nodes. No
apparent complications.

ADDENDUM:
Pathology revealed BENIGN LYMPH NODE WITH REACTIVE FOLLICULAR
HYPERPLASIA of the RIGHT axilla. This was found to be concordant by
Dr. Quirijn Amazigh.

Pathology revealed BENIGN LYMPH NODE of the LEFT axilla. Flow
cytometry is negative for a monoclonal B-cell or phenotypically
aberrant T-cell population. This was found to be concordant by Dr.
Quirijn Amazigh.

Pathology results were discussed with the patient by telephone. The
patient reported doing well after the biopsies with tenderness at
the sites. Post biopsy instructions and care were reviewed and
questions were answered. The patient was encouraged to call The
direct phone number was provided.

The patient was instructed to continue clinical follow-up as needed.

Pathology results reported by Haochen Forrester, RN on 07/04/2020.

*** End of Addendum ***
Patient had first Iovid-N7 vaccine in the RIGHT arm and second LEFT
arm in Sunday December, 2019. After the second vaccination, patient developed
visible tender LEFT axillary swelling. Following IV injection of
contrast for MRI in [REDACTED], patient developed thrombosis of the RIGHT
cephalic vein.

EXAM:
US AXILLARY NODE CORE BIOPSY RIGHT

US AXILLARY NODE CORE BIOPSY LEFT
PROCEDURE:
RIGHT axilla: Targeted ultrasound is performed of the RIGHT axilla,
again showing single mildly prominent lymph node in the UPPER RIGHT
axilla, with cortex of 3.7 millimeters, previously 4.5 millimeters.
Other axillary lymph nodes have normal morphology.

LEFT axilla: Targeted ultrasound again demonstrates a mildly
prominent lymph node in the LOWER LEFT axilla, with cortical
thickening of 3.7 millimeters, previously 3.6 millimeters. A second
lymph node has a cortical thickness of 2.8 millimeters, UPPER limits
normal, and stable. Other lymph nodes have normal morphology.

Given the persistent prominence of bilateral single axial lymph
nodes, we discussed recommendation for ultrasound-guided core biopsy
of bilateral axillary lymph nodes. Patient agrees to biopsy,
performed on the same day.

I met with the patient and we discussed the procedure of
ultrasound-guided biopsy, including benefits and alternatives. We
discussed the high likelihood of a successful procedure. We
discussed the risks of the procedure, including infection, bleeding,
tissue injury, clip migration, and inadequate sampling. Informed
written consent was given. The usual time-out protocol was performed
immediately prior to the procedure.

RIGHT axilla:

Using sterile technique and 1% Lidocaine as local anesthetic, under
direct ultrasound visualization, a 14 gauge Es device was
used to perform biopsy of enlarged RIGHT axillary lymph node using a
LATERAL to MEDIAL approach.

LEFT axilla:

Using sterile technique and 1% Lidocaine as local anesthetic, under
direct ultrasound visualization, a 14 gauge Es device was
used to perform biopsy of enlarged LEFT axillary lymph node using a
LATERAL to MEDIAL approach.
IMPRESSION: Ultrasound guided biopsy of bilateral axillary lymph nodes. No
apparent complications.

## 2021-01-16 IMAGING — US US AXILLARY NODE CORE BIOPSY RIGHT
1 series · 12 of 12 positions shown · non-contrast
Comparison: Previous exam(s).
COMPARISON: Previous exam(s).

Addendum:
CLINICAL DATA: Patient presents for follow-up versus possible
biopsy of bilateral axillary lymph nodes.

[Series 1: us axillary node core biopsy right · 0.06mm/px · 12 of 12 slices shown]
[im 1/12]
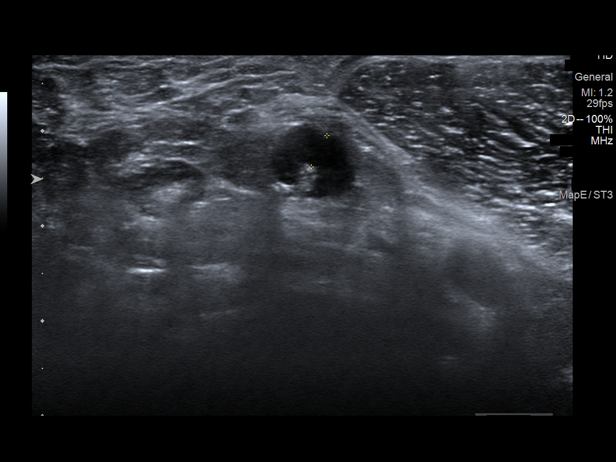
[im 2/12]
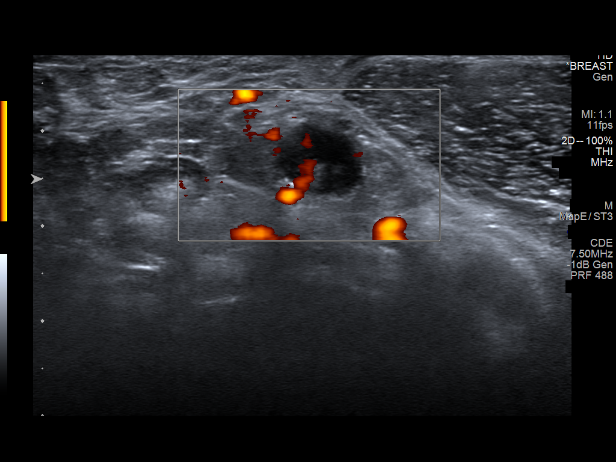
[im 3/12]
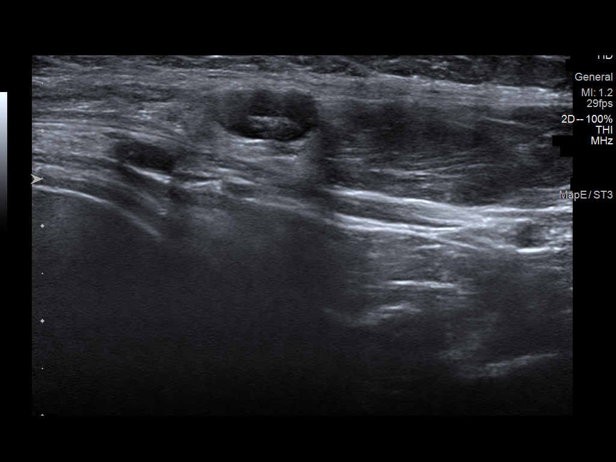
[im 4/12]
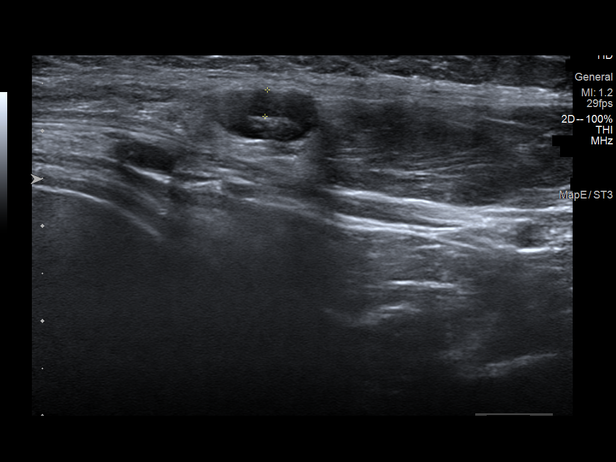
[im 5/12]
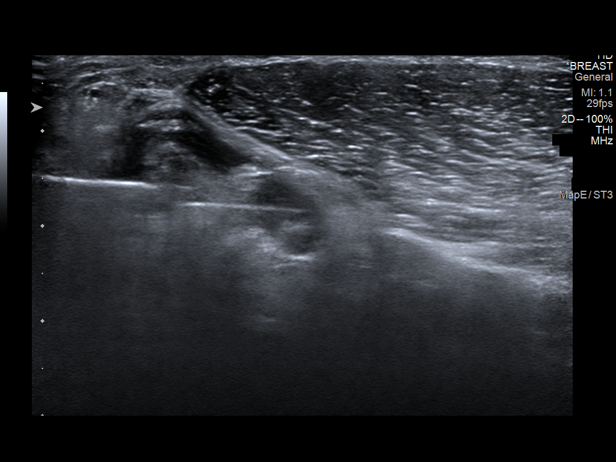
[im 6/12]
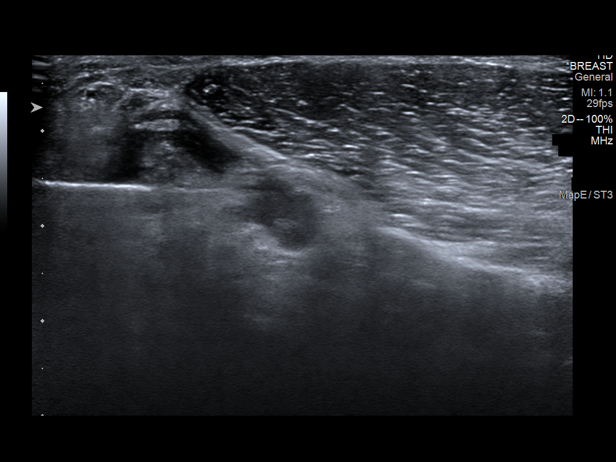
[im 7/12]
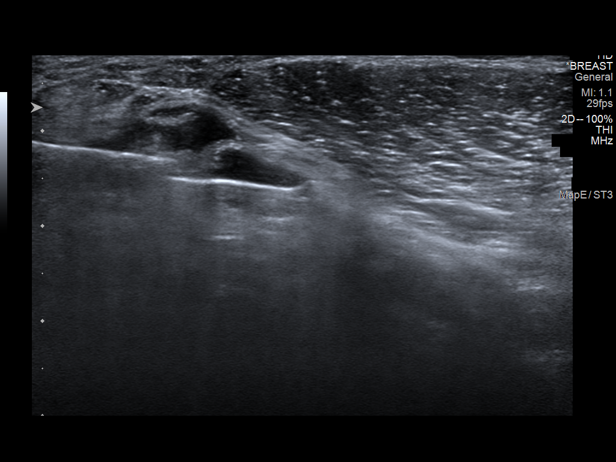
[im 8/12]
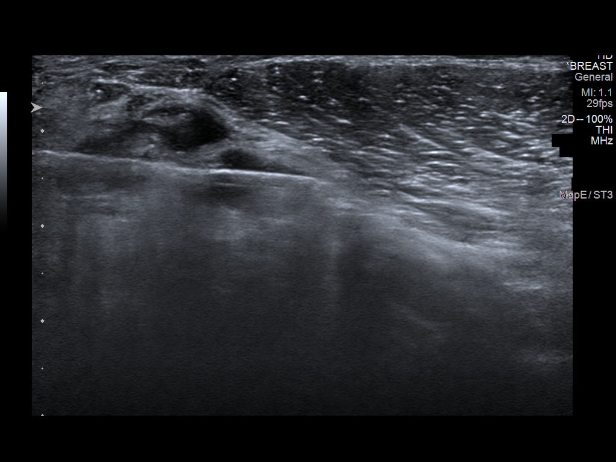
[im 9/12]
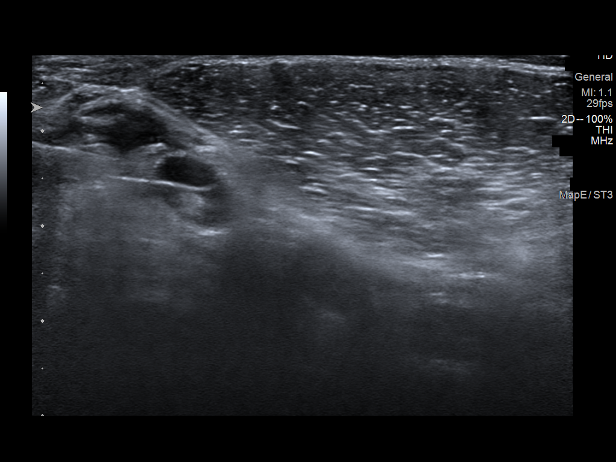
[im 10/12]
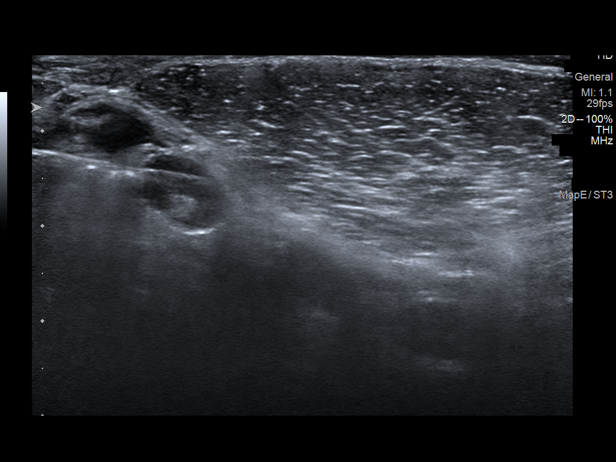
[im 11/12]
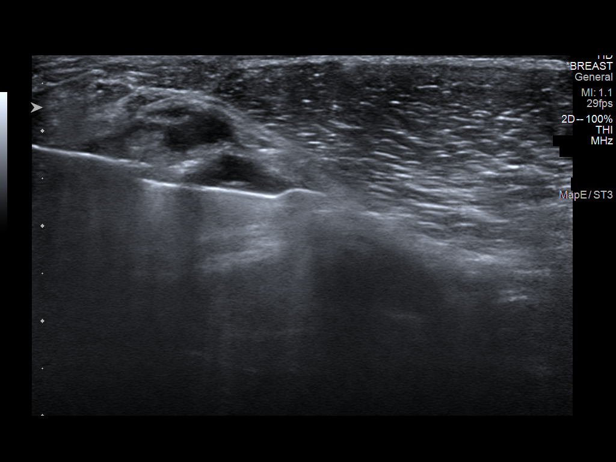
[im 12/12]
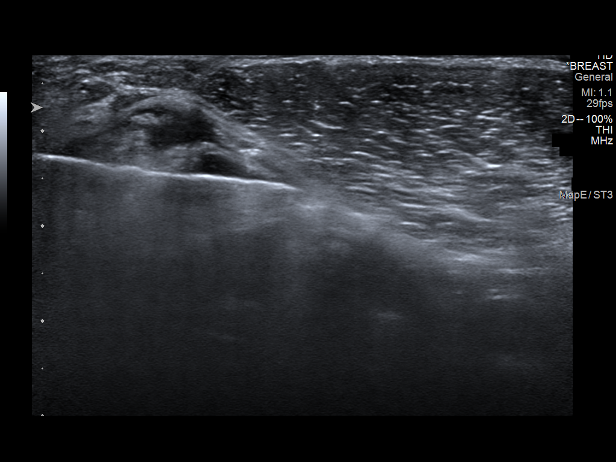

[12 of 12 positions shown; findings below may reference images not displayed]

Patient had first Iovid-N7 vaccine in the RIGHT arm and second LEFT
arm in Sunday December, 2019. After the second vaccination, patient developed
visible tender LEFT axillary swelling. Following IV injection of
contrast for MRI in [REDACTED], patient developed thrombosis of the RIGHT
cephalic vein.

EXAM:
US AXILLARY NODE CORE BIOPSY RIGHT

US AXILLARY NODE CORE BIOPSY LEFT
PROCEDURE:
RIGHT axilla: Targeted ultrasound is performed of the RIGHT axilla,
again showing single mildly prominent lymph node in the UPPER RIGHT
axilla, with cortex of 3.7 millimeters, previously 4.5 millimeters.
Other axillary lymph nodes have normal morphology.

LEFT axilla: Targeted ultrasound again demonstrates a mildly
prominent lymph node in the LOWER LEFT axilla, with cortical
thickening of 3.7 millimeters, previously 3.6 millimeters. A second
lymph node has a cortical thickness of 2.8 millimeters, UPPER limits
normal, and stable. Other lymph nodes have normal morphology.

Given the persistent prominence of bilateral single axial lymph
nodes, we discussed recommendation for ultrasound-guided core biopsy
of bilateral axillary lymph nodes. Patient agrees to biopsy,
performed on the same day.

I met with the patient and we discussed the procedure of
ultrasound-guided biopsy, including benefits and alternatives. We
discussed the high likelihood of a successful procedure. We
discussed the risks of the procedure, including infection, bleeding,
tissue injury, clip migration, and inadequate sampling. Informed
written consent was given. The usual time-out protocol was performed
immediately prior to the procedure.

RIGHT axilla:

Using sterile technique and 1% Lidocaine as local anesthetic, under
direct ultrasound visualization, a 14 gauge Es device was
used to perform biopsy of enlarged RIGHT axillary lymph node using a
LATERAL to MEDIAL approach.

LEFT axilla:

Using sterile technique and 1% Lidocaine as local anesthetic, under
direct ultrasound visualization, a 14 gauge Es device was
used to perform biopsy of enlarged LEFT axillary lymph node using a
LATERAL to MEDIAL approach.
IMPRESSION: Ultrasound guided biopsy of bilateral axillary lymph nodes. No
apparent complications.

ADDENDUM:
Pathology revealed BENIGN LYMPH NODE WITH REACTIVE FOLLICULAR
HYPERPLASIA of the RIGHT axilla. This was found to be concordant by
Dr. Quirijn Amazigh.

Pathology revealed BENIGN LYMPH NODE of the LEFT axilla. Flow
cytometry is negative for a monoclonal B-cell or phenotypically
aberrant T-cell population. This was found to be concordant by Dr.
Quirijn Amazigh.

Pathology results were discussed with the patient by telephone. The
patient reported doing well after the biopsies with tenderness at
the sites. Post biopsy instructions and care were reviewed and
questions were answered. The patient was encouraged to call The
direct phone number was provided.

The patient was instructed to continue clinical follow-up as needed.

Pathology results reported by Haochen Forrester, RN on 07/04/2020.

*** End of Addendum ***
Patient had first Iovid-N7 vaccine in the RIGHT arm and second LEFT
arm in Sunday December, 2019. After the second vaccination, patient developed
visible tender LEFT axillary swelling. Following IV injection of
contrast for MRI in [REDACTED], patient developed thrombosis of the RIGHT
cephalic vein.

EXAM:
US AXILLARY NODE CORE BIOPSY RIGHT

US AXILLARY NODE CORE BIOPSY LEFT
PROCEDURE:
RIGHT axilla: Targeted ultrasound is performed of the RIGHT axilla,
again showing single mildly prominent lymph node in the UPPER RIGHT
axilla, with cortex of 3.7 millimeters, previously 4.5 millimeters.
Other axillary lymph nodes have normal morphology.

LEFT axilla: Targeted ultrasound again demonstrates a mildly
prominent lymph node in the LOWER LEFT axilla, with cortical
thickening of 3.7 millimeters, previously 3.6 millimeters. A second
lymph node has a cortical thickness of 2.8 millimeters, UPPER limits
normal, and stable. Other lymph nodes have normal morphology.

Given the persistent prominence of bilateral single axial lymph
nodes, we discussed recommendation for ultrasound-guided core biopsy
of bilateral axillary lymph nodes. Patient agrees to biopsy,
performed on the same day.

I met with the patient and we discussed the procedure of
ultrasound-guided biopsy, including benefits and alternatives. We
discussed the high likelihood of a successful procedure. We
discussed the risks of the procedure, including infection, bleeding,
tissue injury, clip migration, and inadequate sampling. Informed
written consent was given. The usual time-out protocol was performed
immediately prior to the procedure.

RIGHT axilla:

Using sterile technique and 1% Lidocaine as local anesthetic, under
direct ultrasound visualization, a 14 gauge Es device was
used to perform biopsy of enlarged RIGHT axillary lymph node using a
LATERAL to MEDIAL approach.

LEFT axilla:

Using sterile technique and 1% Lidocaine as local anesthetic, under
direct ultrasound visualization, a 14 gauge Es device was
used to perform biopsy of enlarged LEFT axillary lymph node using a
LATERAL to MEDIAL approach.
IMPRESSION: Ultrasound guided biopsy of bilateral axillary lymph nodes. No
apparent complications.

## 2021-01-31 DIAGNOSIS — Z20822 Contact with and (suspected) exposure to covid-19: Secondary | ICD-10-CM | POA: Diagnosis not present

## 2021-02-18 DIAGNOSIS — B070 Plantar wart: Secondary | ICD-10-CM | POA: Diagnosis not present

## 2021-05-11 DIAGNOSIS — B070 Plantar wart: Secondary | ICD-10-CM | POA: Diagnosis not present

## 2021-06-18 DIAGNOSIS — R0989 Other specified symptoms and signs involving the circulatory and respiratory systems: Secondary | ICD-10-CM | POA: Diagnosis not present

## 2021-06-18 DIAGNOSIS — R29898 Other symptoms and signs involving the musculoskeletal system: Secondary | ICD-10-CM | POA: Diagnosis not present

## 2021-07-29 DIAGNOSIS — H52203 Unspecified astigmatism, bilateral: Secondary | ICD-10-CM | POA: Diagnosis not present

## 2021-07-29 DIAGNOSIS — H40013 Open angle with borderline findings, low risk, bilateral: Secondary | ICD-10-CM | POA: Diagnosis not present

## 2021-09-02 DIAGNOSIS — Z125 Encounter for screening for malignant neoplasm of prostate: Secondary | ICD-10-CM | POA: Diagnosis not present

## 2021-09-02 DIAGNOSIS — E7800 Pure hypercholesterolemia, unspecified: Secondary | ICD-10-CM | POA: Diagnosis not present

## 2021-09-02 DIAGNOSIS — R946 Abnormal results of thyroid function studies: Secondary | ICD-10-CM | POA: Diagnosis not present

## 2021-09-09 DIAGNOSIS — Z1339 Encounter for screening examination for other mental health and behavioral disorders: Secondary | ICD-10-CM | POA: Diagnosis not present

## 2021-09-09 DIAGNOSIS — Z13220 Encounter for screening for lipoid disorders: Secondary | ICD-10-CM | POA: Diagnosis not present

## 2021-09-09 DIAGNOSIS — Z0000 Encounter for general adult medical examination without abnormal findings: Secondary | ICD-10-CM | POA: Diagnosis not present

## 2021-09-09 DIAGNOSIS — Z1331 Encounter for screening for depression: Secondary | ICD-10-CM | POA: Diagnosis not present

## 2021-09-09 DIAGNOSIS — G4452 New daily persistent headache (NDPH): Secondary | ICD-10-CM | POA: Diagnosis not present

## 2021-09-16 ENCOUNTER — Other Ambulatory Visit: Payer: Self-pay

## 2021-09-16 ENCOUNTER — Ambulatory Visit (INDEPENDENT_AMBULATORY_CARE_PROVIDER_SITE_OTHER): Payer: BC Managed Care – PPO | Admitting: Podiatry

## 2021-09-16 DIAGNOSIS — B070 Plantar wart: Secondary | ICD-10-CM | POA: Diagnosis not present

## 2021-09-21 NOTE — Progress Notes (Signed)
° °  HPI: 41 y.o. male presenting today as a new patient for evaluation of plantar warts to the bilateral feet that have been present for few years now.  He has seen other physicians in the past and tried freezing them off.  Aggravated by applying pressure to the area.  He says that the warts are to his right plantar heel and left forefoot.  He presents for further treatment and evaluation  Past Medical History:  Diagnosis Date   GERD (gastroesophageal reflux disease)     Past Surgical History:  Procedure Laterality Date   VASECTOMY     No Known Allergies   Physical Exam: General: The patient is alert and oriented x3 in no acute distress.  Dermatology: Superficial spreading type plantar verruca noted along the right plantar heel about 2.0 x 3.0 cm Solitary nodular plantar verruca noted to the plantar aspect of the left forefoot with pinpoint bleeding upon debridement. Measures approximately 0.5x0.5cm.  Associated tenderness to palpation.   Vascular: Palpable pedal pulses bilaterally. Capillary refill within normal limits.  Negative for any significant edema or erythema  Neurological: Light touch and protective threshold grossly intact  Musculoskeletal Exam: No pedal deformities noted  Assessment: 1. Superficial spreading type plantar verruca RT plantar heel 2. 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 Patient evaluated. Light debridement was performed to the left forefoot using a 312 scalpel with pinpoint bleeding upon debridement. 2. Salinocaine applied to the bilateral plantar warts. Salicylic acid provided daily to apply daily under occlusion with a bandaid.  3. Return to clinic 4 weeks  *Radiologist      Edrick Kins, DPM [facility]  Dr. Edrick Kins, DPM    [address], [place] [address]                Office [phone]  Fax [phone]

## 2021-10-14 ENCOUNTER — Ambulatory Visit: Payer: BC Managed Care – PPO | Admitting: Podiatry

## 2021-10-21 ENCOUNTER — Ambulatory Visit (INDEPENDENT_AMBULATORY_CARE_PROVIDER_SITE_OTHER): Payer: BC Managed Care – PPO | Admitting: Podiatry

## 2021-10-21 ENCOUNTER — Other Ambulatory Visit: Payer: Self-pay

## 2021-10-21 DIAGNOSIS — B070 Plantar wart: Secondary | ICD-10-CM

## 2021-10-28 NOTE — Progress Notes (Signed)
? ?  HPI: 41 y.o. male presenting today as a new patient for evaluation of plantar warts to the bilateral feet that have been present for few years now.  He has seen other physicians in the past and tried freezing them off.  Aggravated by applying pressure to the area.  He says that the warts are to his right plantar heel and left forefoot.  He presents for further treatment and evaluation ? ?Past Medical History:  ?Diagnosis Date  ? GERD (gastroesophageal reflux disease)   ? ? ?Past Surgical History:  ?Procedure Laterality Date  ? VASECTOMY    ? ?No Known Allergies ?  ?Physical Exam: ?General: The patient is alert and oriented x3 in no acute distress. ? ?Dermatology: Superficial spreading type plantar verruca noted along the right plantar heel about 2.0 x 3.0 cm ?Solitary nodular plantar verruca noted to the plantar aspect of the left forefoot with pinpoint bleeding upon debridement. Measures approximately 0.5x0.5cm.  ?Associated tenderness to palpation.  ? ?Vascular: Palpable pedal pulses bilaterally. Capillary refill within normal limits.  Negative for any significant edema or erythema ? ?Neurological: Light touch and protective threshold grossly intact ? ?Musculoskeletal Exam: No pedal deformities noted ? ?Assessment: ?1. Superficial spreading type plantar verruca RT plantar heel ?2. 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 ?Plan of Care:  ?1. Patient evaluated.  ?2.  Excisional debridement of all of the macerated peeling skin around the verruca lesions was performed using combination of a tissue nipper and 312 scalpel.  There does appear to be some improved appearance of the verruca lesions ?3.  Discontinue the salicylic acid to allow the skin to repair back to normal tissue ?4.  Patient will contact me with an update in 2 weeks.  He may need to come back into the office for 1 final debridement if the skin is very thick ?5.  Return to clinic as needed ? ?*Radiologist for [facility] radiology.   Mammograms  ?  ?  ?Edrick Kins, DPM ?[place] ? ?Dr. Edrick Kins, DPM  ?  ?[address]                                        ?[place], [place] [address]                ?Office [phone]  ?Fax [phone] ? ? ? ? ?

## 2022-04-14 DIAGNOSIS — D2262 Melanocytic nevi of left upper limb, including shoulder: Secondary | ICD-10-CM | POA: Diagnosis not present

## 2022-04-14 DIAGNOSIS — D2271 Melanocytic nevi of right lower limb, including hip: Secondary | ICD-10-CM | POA: Diagnosis not present

## 2022-04-14 DIAGNOSIS — D2261 Melanocytic nevi of right upper limb, including shoulder: Secondary | ICD-10-CM | POA: Diagnosis not present

## 2022-04-14 DIAGNOSIS — D225 Melanocytic nevi of trunk: Secondary | ICD-10-CM | POA: Diagnosis not present

## 2022-08-06 DIAGNOSIS — H40013 Open angle with borderline findings, low risk, bilateral: Secondary | ICD-10-CM | POA: Diagnosis not present

## 2022-08-06 DIAGNOSIS — H52203 Unspecified astigmatism, bilateral: Secondary | ICD-10-CM | POA: Diagnosis not present

## 2022-09-08 DIAGNOSIS — E785 Hyperlipidemia, unspecified: Secondary | ICD-10-CM | POA: Diagnosis not present

## 2022-09-08 DIAGNOSIS — Z125 Encounter for screening for malignant neoplasm of prostate: Secondary | ICD-10-CM | POA: Diagnosis not present

## 2022-09-15 DIAGNOSIS — R82998 Other abnormal findings in urine: Secondary | ICD-10-CM | POA: Diagnosis not present

## 2022-09-15 DIAGNOSIS — Z23 Encounter for immunization: Secondary | ICD-10-CM | POA: Diagnosis not present

## 2022-09-15 DIAGNOSIS — F411 Generalized anxiety disorder: Secondary | ICD-10-CM | POA: Diagnosis not present

## 2022-09-15 DIAGNOSIS — Z1331 Encounter for screening for depression: Secondary | ICD-10-CM | POA: Diagnosis not present

## 2022-09-15 DIAGNOSIS — Z1339 Encounter for screening examination for other mental health and behavioral disorders: Secondary | ICD-10-CM | POA: Diagnosis not present

## 2022-09-15 DIAGNOSIS — Z0000 Encounter for general adult medical examination without abnormal findings: Secondary | ICD-10-CM | POA: Diagnosis not present

## 2023-04-15 DIAGNOSIS — E60 Dietary zinc deficiency: Secondary | ICD-10-CM | POA: Diagnosis not present

## 2023-04-15 DIAGNOSIS — E291 Testicular hypofunction: Secondary | ICD-10-CM | POA: Diagnosis not present

## 2023-04-15 DIAGNOSIS — E785 Hyperlipidemia, unspecified: Secondary | ICD-10-CM | POA: Diagnosis not present

## 2023-04-15 DIAGNOSIS — R891 Abnormal level of hormones in specimens from other organs, systems and tissues: Secondary | ICD-10-CM | POA: Diagnosis not present

## 2023-04-20 DIAGNOSIS — D2271 Melanocytic nevi of right lower limb, including hip: Secondary | ICD-10-CM | POA: Diagnosis not present

## 2023-04-20 DIAGNOSIS — D2262 Melanocytic nevi of left upper limb, including shoulder: Secondary | ICD-10-CM | POA: Diagnosis not present

## 2023-04-20 DIAGNOSIS — D2261 Melanocytic nevi of right upper limb, including shoulder: Secondary | ICD-10-CM | POA: Diagnosis not present

## 2023-04-20 DIAGNOSIS — D225 Melanocytic nevi of trunk: Secondary | ICD-10-CM | POA: Diagnosis not present

## 2023-04-29 DIAGNOSIS — R891 Abnormal level of hormones in specimens from other organs, systems and tissues: Secondary | ICD-10-CM | POA: Diagnosis not present

## 2023-04-29 DIAGNOSIS — E785 Hyperlipidemia, unspecified: Secondary | ICD-10-CM | POA: Diagnosis not present

## 2023-04-29 DIAGNOSIS — E611 Iron deficiency: Secondary | ICD-10-CM | POA: Diagnosis not present

## 2023-04-29 DIAGNOSIS — E559 Vitamin D deficiency, unspecified: Secondary | ICD-10-CM | POA: Diagnosis not present

## 2023-04-29 DIAGNOSIS — E60 Dietary zinc deficiency: Secondary | ICD-10-CM | POA: Diagnosis not present

## 2023-04-29 DIAGNOSIS — Z131 Encounter for screening for diabetes mellitus: Secondary | ICD-10-CM | POA: Diagnosis not present

## 2023-04-29 DIAGNOSIS — E291 Testicular hypofunction: Secondary | ICD-10-CM | POA: Diagnosis not present

## 2023-04-29 DIAGNOSIS — E538 Deficiency of other specified B group vitamins: Secondary | ICD-10-CM | POA: Diagnosis not present

## 2023-05-19 DIAGNOSIS — E785 Hyperlipidemia, unspecified: Secondary | ICD-10-CM | POA: Diagnosis not present

## 2023-05-19 DIAGNOSIS — R891 Abnormal level of hormones in specimens from other organs, systems and tissues: Secondary | ICD-10-CM | POA: Diagnosis not present

## 2023-05-19 DIAGNOSIS — E291 Testicular hypofunction: Secondary | ICD-10-CM | POA: Diagnosis not present

## 2023-05-19 DIAGNOSIS — E612 Magnesium deficiency: Secondary | ICD-10-CM | POA: Diagnosis not present

## 2023-05-27 DIAGNOSIS — R062 Wheezing: Secondary | ICD-10-CM | POA: Diagnosis not present

## 2023-05-27 DIAGNOSIS — J189 Pneumonia, unspecified organism: Secondary | ICD-10-CM | POA: Diagnosis not present

## 2023-08-19 DIAGNOSIS — H40013 Open angle with borderline findings, low risk, bilateral: Secondary | ICD-10-CM | POA: Diagnosis not present

## 2023-08-19 DIAGNOSIS — H52203 Unspecified astigmatism, bilateral: Secondary | ICD-10-CM | POA: Diagnosis not present

## 2023-08-19 DIAGNOSIS — H04123 Dry eye syndrome of bilateral lacrimal glands: Secondary | ICD-10-CM | POA: Diagnosis not present

## 2023-10-05 DIAGNOSIS — E039 Hypothyroidism, unspecified: Secondary | ICD-10-CM | POA: Diagnosis not present

## 2023-10-05 DIAGNOSIS — Z125 Encounter for screening for malignant neoplasm of prostate: Secondary | ICD-10-CM | POA: Diagnosis not present

## 2023-10-12 DIAGNOSIS — E785 Hyperlipidemia, unspecified: Secondary | ICD-10-CM | POA: Diagnosis not present

## 2023-10-20 DIAGNOSIS — R82998 Other abnormal findings in urine: Secondary | ICD-10-CM | POA: Diagnosis not present

## 2023-10-20 DIAGNOSIS — Z1331 Encounter for screening for depression: Secondary | ICD-10-CM | POA: Diagnosis not present

## 2023-10-20 DIAGNOSIS — Z1339 Encounter for screening examination for other mental health and behavioral disorders: Secondary | ICD-10-CM | POA: Diagnosis not present

## 2023-10-20 DIAGNOSIS — Z0000 Encounter for general adult medical examination without abnormal findings: Secondary | ICD-10-CM | POA: Diagnosis not present

## 2023-10-20 DIAGNOSIS — E785 Hyperlipidemia, unspecified: Secondary | ICD-10-CM | POA: Diagnosis not present
